# Patient Record
Sex: Female | Born: 2000 | Race: White | Hispanic: No | Marital: Single | Smoking: Never smoker
Health system: Southern US, Community
[De-identification: ages and names within clinical notes are randomized; demographics above are authoritative.]

---
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
## 2022-09-06 IMAGING — CR DG CHEST 2V
2 series · 2 of 2 positions shown · non-contrast
Comparison: None.

CLINICAL DATA: Shortness of breath and wheezing.

EXAM:
CHEST - 2 VIEW

[chest pa]
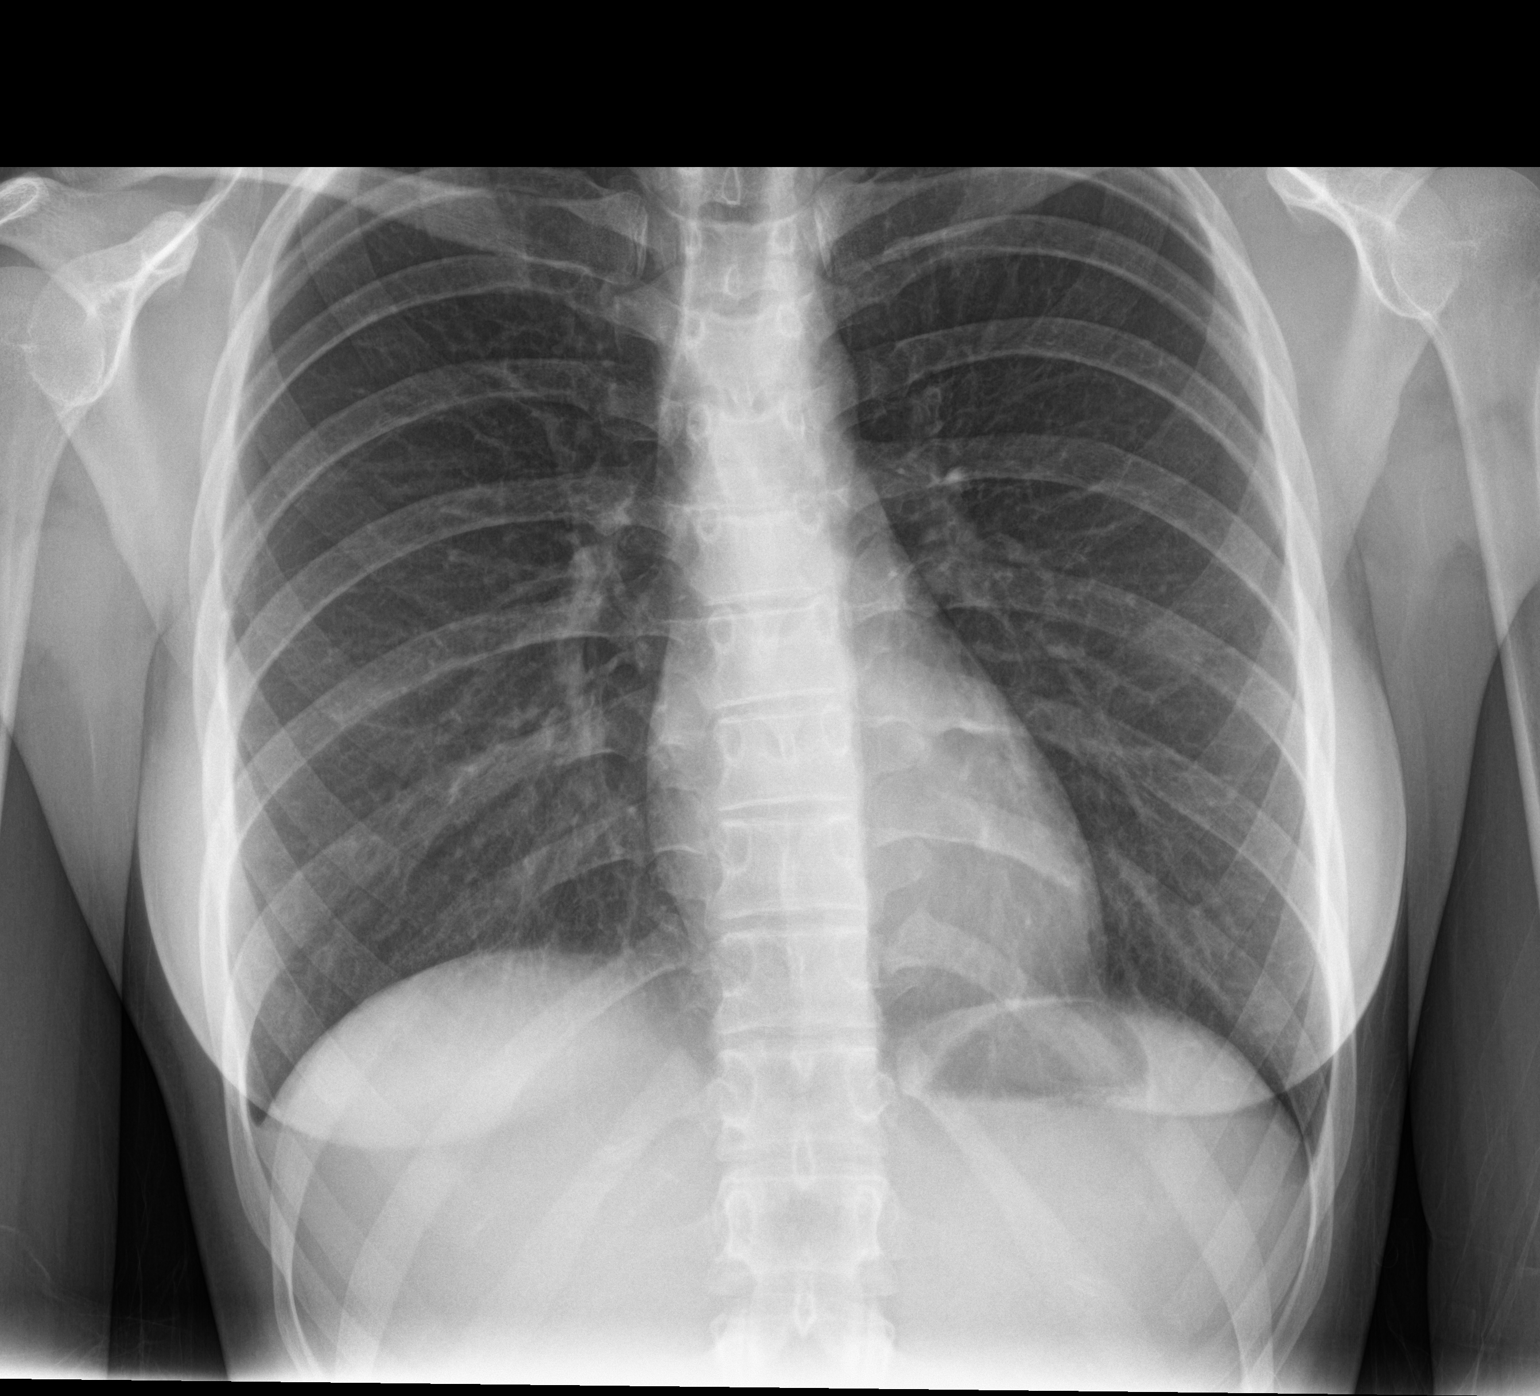

[chest lat]
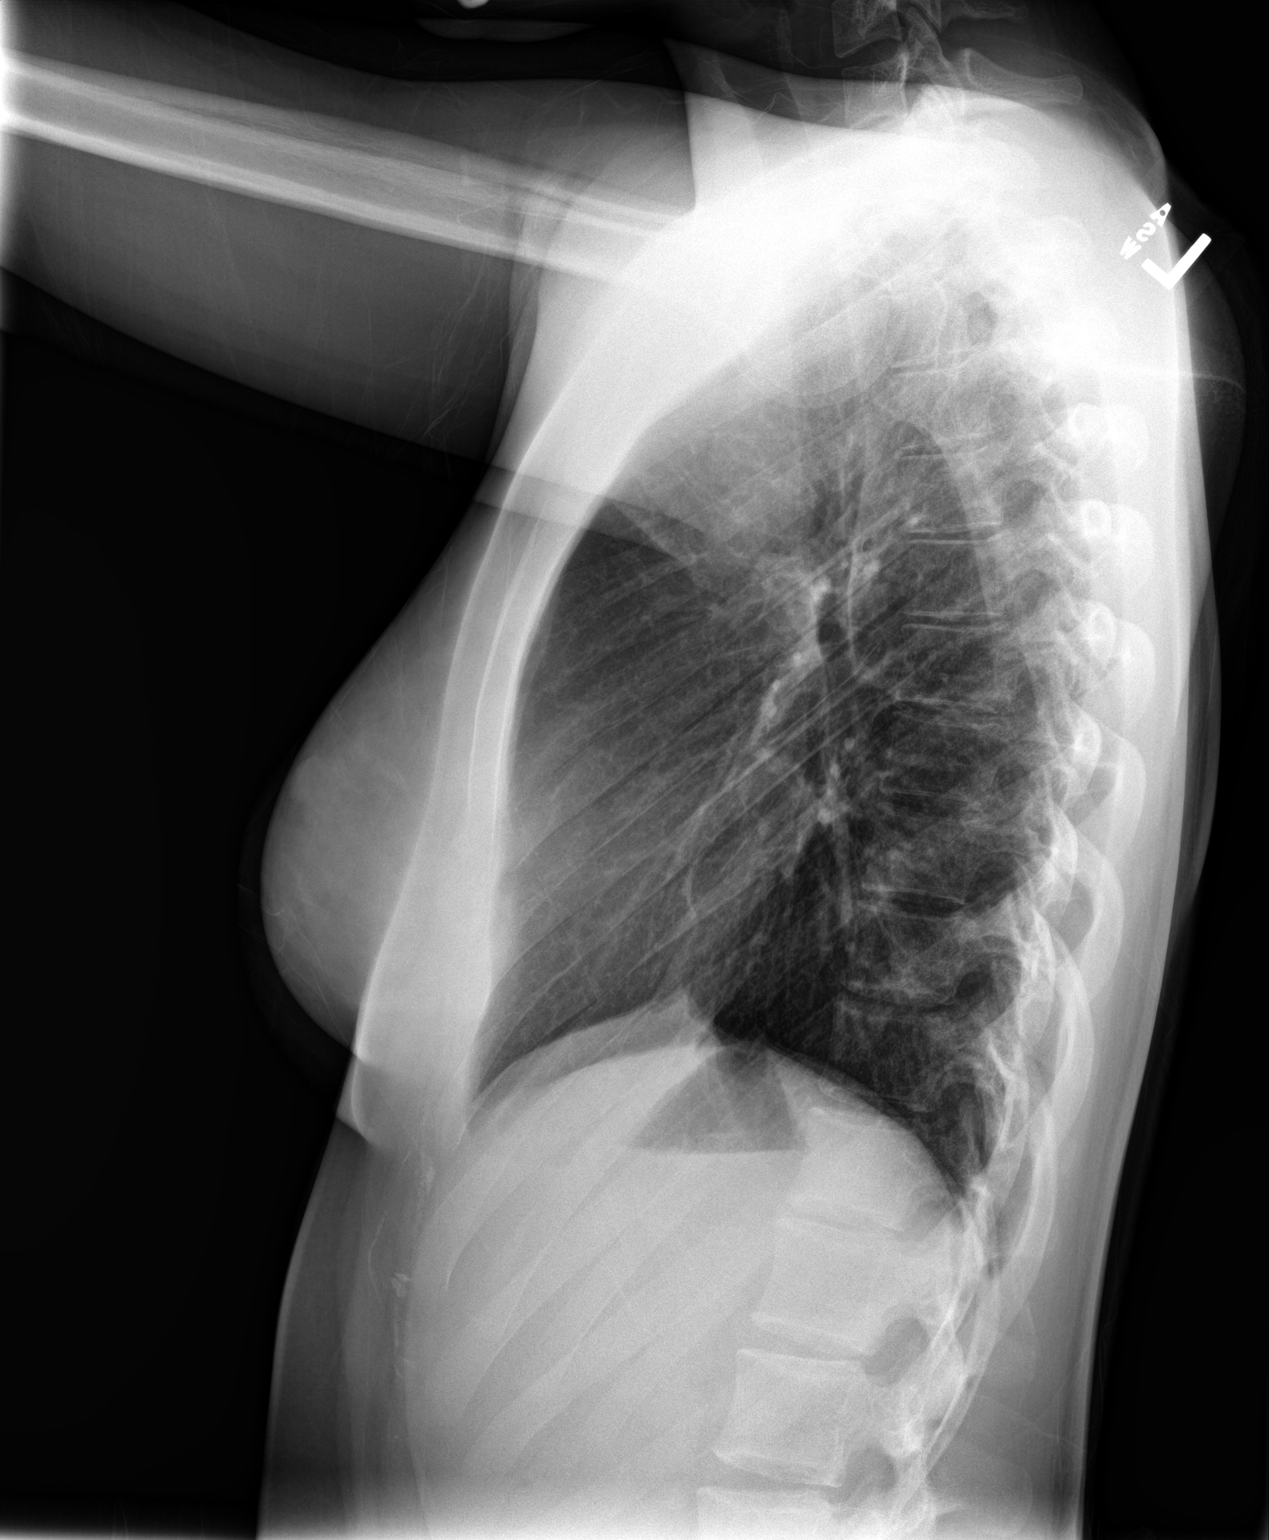

[2 of 2 positions shown; findings below may reference images not displayed]

FINDINGS: The heart size and mediastinal contours are within normal limits.
Both lungs are clear. The visualized skeletal structures are
unremarkable.
IMPRESSION: No active cardiopulmonary disease.
